# Patient Record
Sex: Female | Born: 1970 | Race: Black or African American | Hispanic: No | Marital: Married | Smoking: Current every day smoker
Health system: Southern US, Community
[De-identification: ages and names within clinical notes are randomized; demographics above are authoritative.]

## PROBLEM LIST (undated history)

## (undated) DIAGNOSIS — G4730 Sleep apnea, unspecified: Secondary | ICD-10-CM

## (undated) DIAGNOSIS — Z8719 Personal history of other diseases of the digestive system: Secondary | ICD-10-CM

## (undated) DIAGNOSIS — D649 Anemia, unspecified: Secondary | ICD-10-CM

## (undated) DIAGNOSIS — H18609 Keratoconus, unspecified, unspecified eye: Secondary | ICD-10-CM

## (undated) DIAGNOSIS — F419 Anxiety disorder, unspecified: Secondary | ICD-10-CM

## (undated) HISTORY — PX: GASTRIC BYPASS: SHX52

## (undated) NOTE — Progress Notes (Signed)
 Formatting of this note might be different from the original. Please call to order repeat in 67M.  Patient notified of results through MyChart.  Good news benign appearing cyst, radiologist recommending repeat in 6 months, we will arrange.  AA Electronically signed by Gustav CORDOBA Cecile, MD at 03/01/2022  4:39 PM EST

---

## 2006-04-10 ENCOUNTER — Encounter: Admission: RE | Admit: 2006-04-10 | Discharge: 2006-07-09 | Payer: Self-pay | Admitting: *Deleted

## 2006-04-10 ENCOUNTER — Ambulatory Visit (HOSPITAL_COMMUNITY): Admission: RE | Admit: 2006-04-10 | Discharge: 2006-04-10 | Payer: Self-pay | Admitting: *Deleted

## 2006-04-18 ENCOUNTER — Ambulatory Visit (HOSPITAL_COMMUNITY): Admission: RE | Admit: 2006-04-18 | Discharge: 2006-04-18 | Payer: Self-pay | Admitting: *Deleted

## 2006-06-26 ENCOUNTER — Ambulatory Visit (HOSPITAL_COMMUNITY): Admission: RE | Admit: 2006-06-26 | Discharge: 2006-06-26 | Payer: Self-pay | Admitting: *Deleted

## 2006-11-19 ENCOUNTER — Inpatient Hospital Stay (HOSPITAL_COMMUNITY): Admission: RE | Admit: 2006-11-19 | Discharge: 2006-11-21 | Payer: Self-pay | Admitting: *Deleted

## 2006-11-20 ENCOUNTER — Ambulatory Visit: Payer: Self-pay | Admitting: Vascular Surgery

## 2006-11-20 ENCOUNTER — Encounter (INDEPENDENT_AMBULATORY_CARE_PROVIDER_SITE_OTHER): Payer: Self-pay | Admitting: *Deleted

## 2006-11-27 ENCOUNTER — Encounter: Admission: RE | Admit: 2006-11-27 | Discharge: 2006-11-27 | Payer: Self-pay | Admitting: *Deleted

## 2010-07-11 NOTE — Op Note (Signed)
NAME:  Jill Simpson, Jill Simpson NO.:  [id number]   MEDICAL RECORD NO.:  [id number]          PATIENT TYPE:  INP   LOCATION:  1228                         FACILITY:  [facility]   PHYSICIAN:  Sharlet Salina T. Hoxworth, M.D.DATE OF BIRTH:  10-23-1970   DATE OF PROCEDURE:  DATE OF DISCHARGE:                               OPERATIVE REPORT   PROCEDURE:  Upper gastrointestinal endoscopy.   DESCRIPTION OF PROCEDURE:  Upper GI endoscopy is performed at the  completion of laparoscopic Roux-en-Y gastric bypass by Dr. Colin Benton.  The  Olympus video endoscope was inserted into the upper esophagus and then  passed under direct vision to the EG junction at 39 cm.  The small  gastric pouch was then entered.  The outlet of the pouch was clamped by  Dr. Colin Benton and the pouch was tensely distended with air under saline  irrigation to inspect for any evidence of leak, and none was seen.  The  anastomosis was visualized and was patent.  The sutures and staple lines  were intact and there was no bleeding.  The gastric mucosa appeared  normal.  The pouch measured between 4-5 cm in length and was tubular in  shape.  Following completion of the procedure, the pouch was desufflated  and the scope withdrawn without difficulty.      Lorne Skeens. Hoxworth, M.D.  Electronically Signed     BTH/MEDQ  D:  11/19/2006  T:  11/19/2006  Job:  [id number]

## 2010-07-11 NOTE — Op Note (Signed)
NAME:  Jill Simpson, Jill Simpson NO.:  [id number]   MEDICAL RECORD NO.:  [id number]          PATIENT TYPE:  INP   LOCATION:  1228                         FACILITY:  [facility]   PHYSICIAN:  Alfonse Ras, MD   DATE OF BIRTH:  11/27/1970   DATE OF PROCEDURE:  DATE OF DISCHARGE:                               OPERATIVE REPORT   PREOPERATIVE DIAGNOSIS:  Medically refractory morbid obesity.   POSTOPERATIVE DIAGNOSIS:  Medically refractory morbid obesity.   PROCEDURE:  Laparoscopic gastric Roux-en-Y gastric bypass, left facing  Roux limb, antecolic, antegastric.   SURGEON:  Alfonse Ras, M.D.   ASSISTANT:  Lorne Skeens. Hoxworth, M.D.   ANESTHESIA:  General.   DESCRIPTION OF PROCEDURE:  The patient was taken to the operating room,  placed in a supine position.  After adequate general anesthesia was  induced using endotracheal tube, the abdomen was prepped and draped in  the normal sterile fashion.  Using an Optiview 12 mm trocar in the left  upper quadrant under direct vision, peritoneal access was obtained.  Pneumoperitoneum was obtained.  Additional 12-mm trocars were placed in  the left upper quadrant and a 12-mm trocar was placed in the right  periumbilical region.  A 5-mm trocar was placed in the left abdomen.  The ligament of Treitz was identified and counting down 40 cm, the small  bowel was transected using a 45 mm white load stapling device.  The  mesentery was divided with a harmonic scalpel.  The Roux limb was marked  with a Penrose drain.  I counted additional 100 cm along the distal end  into the side-to-side jejunojejunostomy in a standard fashion using a  harmonic scalpel to make the enterotomies and then a 45-mm white load  stapling device.  The defect was closed with running 3-0 Vicryl suture.  Mesenteric defect was closed with a running 2-0 silk suture.  The  anastomosis was then inspected and reinforced using Tisseel.   Then I turned my attention to the  upper abdomen.  All tubes were removed  from the mouth except for the endotracheal tube.  The angle of His was  sharply and bluntly dissected.  An area on the lesser curve  approximately 45 cm distal to the EG junction was identified.  Dissection was taken on the lesser curve into the retrogastric space in  the lesser sac.  Using serial firings of the first gold load 60 mm GIA,  and then three additional, and lastly two additional blue load GIAs with  the Ewald down through the EG junction, the pouch was created.  Adequate  hemostasis was assured.  The remnant was oversewn using a running 2-0  silk suture.  A side-to-side gastrojejunostomy was then performed.  The  enterotomies were made with a harmonic scalpel and a 45 blue load GIA  stapling device was used to create the anastomosis.  The defect was  closed with a running 3-0 Vicryl sutures.  The anterior layer was also  closed with a running 3-0 Vicryl suture.  This was performed over the  [facility] in place.  Petersen's defect  was closed with running 2-0 silk  suture as well.  The upper abdomen was then irrigated with saline and  upper endoscopy was performed by Dr. Glenna Fellows, which showed  patent anastomosis, a 4-cm pouch,  and no evidence of leak.  The endoscope was removed.  The  gastrojejunostomy was reinforced with Tisseel.  Adequate hemostasis was  ensured.  Pneumoperitoneum was released.  Trocars were removed and skin  incisions were closed with staples.  The patient tolerated the procedure  well and went to PACU in good condition.      Alfonse Ras, MD  Electronically Signed     KRE/MEDQ  D:  11/19/2006  T:  11/19/2006  Job:  [id number]

## 2010-08-02 ENCOUNTER — Emergency Department (HOSPITAL_COMMUNITY): Admission: EM | Admit: 2010-08-02 | Payer: Self-pay | Source: Home / Self Care

## 2010-12-07 LAB — CBC
HCT: 32.3 — ABNORMAL LOW
HCT: 34.7 — ABNORMAL LOW
HCT: 38.2
Hemoglobin: 10.3 — ABNORMAL LOW
Hemoglobin: 12.3
MCHC: 31.8
MCHC: 31.9
MCHC: 32.3
MCV: 77.7 — ABNORMAL LOW
MCV: 77.7 — ABNORMAL LOW
MCV: 78
Platelets: 215
Platelets: 242
Platelets: 254
RBC: 4.15
RBC: 4.91
RDW: 16.3 — ABNORMAL HIGH
RDW: 16.5 — ABNORMAL HIGH
RDW: 16.7 — ABNORMAL HIGH
WBC: 10.3
WBC: 9.4

## 2010-12-07 LAB — DIFFERENTIAL
Basophils Absolute: 0
Basophils Absolute: 0.1
Basophils Absolute: 0.1
Basophils Relative: 0
Eosinophils Absolute: 0
Eosinophils Absolute: 0.2
Eosinophils Relative: 2
Lymphocytes Relative: 18
Lymphocytes Relative: 23
Lymphocytes Relative: 28
Lymphs Abs: 2.4
Lymphs Abs: 2.7
Monocytes Absolute: 0.6
Monocytes Relative: 6
Monocytes Relative: 6
Monocytes Relative: 6
Neutro Abs: 6
Neutro Abs: 7.2
Neutro Abs: 8.7 — ABNORMAL HIGH
Neutrophils Relative %: 64
Neutrophils Relative %: 75

## 2010-12-07 LAB — BASIC METABOLIC PANEL WITH GFR
BUN: 10
CO2: 27
Calcium: 9.5
Chloride: 108
Creatinine, Ser: 0.81
GFR calc non Af Amer: >60
Glucose, Bld: 98
Potassium: 4.4
Sodium: 143

## 2013-08-05 ENCOUNTER — Ambulatory Visit (INDEPENDENT_AMBULATORY_CARE_PROVIDER_SITE_OTHER): Payer: 59 | Admitting: Surgery

## 2013-08-05 VITALS — BP 132/84 | HR 76 | Temp 98.0°F | Resp 18 | Ht 65.0 in | Wt 280.0 lb

## 2013-08-05 DIAGNOSIS — Z9884 Bariatric surgery status: Secondary | ICD-10-CM | POA: Insufficient documentation

## 2013-08-05 MED ORDER — PANTOPRAZOLE SODIUM 40 MG PO TBEC: 40.0000 mg | DELAYED_RELEASE_TABLET | Freq: Every day | ORAL | Status: DC

## 2013-08-05 NOTE — Patient Instructions (Addendum)
Thanks for your patience.  If you need further assistance after leaving the office, please call our office and speak with a [facility] nurse.  [phone].  If you want to leave a message for Dr. Daphine DeutscherMartin, please call his office phone at [phone].  Gastric Bypass Surgery Care After Refer to this sheet in the next few weeks. These discharge instructions provide you with general information on caring for yourself after you leave the hospital. Your caregiver may also give you specific instructions. Your treatment has been planned according to the most current medical practices available, but unavoidable complications sometimes occur. If you have any problems or questions after discharge, call your caregiver. HOME CARE INSTRUCTIONS  Activity  Take frequent walks throughout the day. This will help to prevent blood clots. Do not sit for longer than 45 minutes to 1 hour while awake for 4 to 6 weeks after surgery.  Continue to do coughing and deep breathing exercises once you get home. This will help to prevent pneumonia.  Do not do strenuous activities, such as heavy lifting, pushing, or pulling, until after your follow-up visit with your caregiver. Do not lift anything heavier than 10 lb (4.5 kg).  Talk with your caregiver about when you may return to work and your exercise routine.  Do not drive while taking prescription pain medicine. Nutrition  It is very important that you drink at least 80 oz (2,400 mL) of fluid a day.  You should stay on a liquid diet until your follow-up visit with your caregiver. Keep sugar-free, liquid items on hand, including:  Tea: hot or cold. Drink only decaffeinated for the first month.  Broths: beef, chicken, vegetable.  Others: water, sugar-free frozen ice pops, flavored water, gelatin (after 1 week).  Do not consume caffeine for 1 month. Large amounts of caffeine can cause dehydration.  A dietician may also give you specific instructions.  Follow your  caregiver's recommendations about vitamins and protein requirements after surgery. Hygiene  You may shower and wash your hair 2 days after surgery. Pat incisions dry. Do not rub incisions with a washcloth or towel.  Follow your caregiver's recommendations about baths and pools following surgery. Pain control  If a prescription medicine was given, follow your caregiver's directions.  You may feel some gas pain caused by the carbon dioxide used to inflate your abdomen during surgery. This pain can be felt in your chest, shoulder, back, or abdominal area. Moving around often is advised. Incision care  You may have 4 or more small incisions. They are closed with skin adhesive strips. Skin adhesive strips can get wet and will fall off on their own. Check your incisions and surrounding area daily for any redness, swelling, discoloration, fluid (drainage), or bleeding. Dark red, dried blood may appear under these coverings. This is normal.  If you have a drain, it will be removed at your follow-up visit or before you leave the hospital.  If your drain is left in, follow your caregiver's instructions on drain care.  If your drain is taken out, keep a clean, dry bandage over the drain site. SEEK MEDICAL CARE IF:   You develop persistent nausea and vomiting.  You have pain and discomfort with swallowing.  You have pain, swelling, or warmth in the lower extremities.  You have an oral temperature above 102 F (38.9 C).  You develop chills.  Your incision sites look red, swollen, or have drainage.  Your stool is black, tarry, or maroon in color.  You are lightheaded when standing.  You notice a bruise getting larger.  You have any questions or concerns. SEEK IMMEDIATE MEDICAL CARE IF:   You have chest pain.  You have severe calf pain or pain not relieved by medicine.  You develop shortness of breath or difficulty breathing.  There is bright red blood coming from the drain.  You  feel confused.  You have slurred speech.  You suddenly feel weak. MAKE SURE YOU:   Understand these instructions.  Will watch your condition.  Will get help right away if you are not doing well or get worse. Document Released: 09/27/2003 Document Revised: 06/09/2012 Document Reviewed: 07/05/2009 [employer] Patient Information 2014 [place], [place].

## 2013-08-05 NOTE — Addendum Note (Signed)
Addended by: Luretha Murphy B on: 08/05/2013 11:19 AM   Modules accepted: Orders

## 2013-08-05 NOTE — Progress Notes (Addendum)
Jill Simpson 43 y.o.  Body mass index is 46.59 kg/(m^2).  Patient Active Problem List   Diagnosis Date Noted  . 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 No primary provider on file. 1. Gastric bypass status for obesity   2. Lap Roux Y Gastric Bypass Sept 2008 Earle     Overall has done well after her roux en Y Gastric bypass by Dr. Colin Benton back in 2008. She has had some weight regain and she is currently talking to a nutritionist about this. She has occasional burning pains in her stomach and I think this may be some gastritis., Treat her with a proton pump inhibitor empirically. Also because of her weight regain she is inch didn't further restriction. A likely get an upper GI to assess the size of her pouch and see whether she would benefit from either band over bypass or re\re plicating her pouch. We'll go and obtain an upper GI series and see her back in 3-4 weeks.   Matt B. Daphine Deutscher, MD, [facility] [phone] beeper [phone]  08/05/2013 11:13 AM   Protonix 40mg  ordered to treat gastritis empirically

## 2013-08-06 ENCOUNTER — Other Ambulatory Visit: Payer: Self-pay

## 2013-08-10 ENCOUNTER — Ambulatory Visit
Admission: RE | Admit: 2013-08-10 | Discharge: 2013-08-10 | Disposition: A | Discharge status: Home / Self Care | Payer: Self-pay | Source: Ambulatory Visit | Attending: Surgery | Admitting: Surgery

## 2013-08-10 ENCOUNTER — Other Ambulatory Visit (INDEPENDENT_AMBULATORY_CARE_PROVIDER_SITE_OTHER): Payer: Self-pay | Admitting: Surgery

## 2013-08-10 DIAGNOSIS — Z9884 Bariatric surgery status: Secondary | ICD-10-CM

## 2013-08-25 ENCOUNTER — Ambulatory Visit (INDEPENDENT_AMBULATORY_CARE_PROVIDER_SITE_OTHER): Payer: 59 | Admitting: Surgery

## 2013-08-25 ENCOUNTER — Encounter (INDEPENDENT_AMBULATORY_CARE_PROVIDER_SITE_OTHER): Payer: Self-pay | Admitting: Surgery

## 2013-08-25 VITALS — BP 165/100 | HR 76 | Temp 98.1°F | Resp 16 | Ht 65.0 in | Wt 280.6 lb

## 2013-08-25 DIAGNOSIS — K449 Diaphragmatic hernia without obstruction or gangrene: Secondary | ICD-10-CM

## 2013-08-25 DIAGNOSIS — Z9884 Bariatric surgery status: Secondary | ICD-10-CM

## 2013-08-25 NOTE — Progress Notes (Signed)
Chief Complaint:  Hiatal hernia after gastric band  History of Present Illness:  Jill Simpson is an 43 y.o. female who underwent a Roux-en-Y gastric bypass for referral in 2008. She has developed some issues with weight regain and also with swallowing difficulties. She had an upper GI series which are reviewed showed her to have a hiatal hernia the pains about half of her pouch which is enlarged. We discussed various means a rim E. This and finally discussed going ahead and performing a laparoscopic hiatal hernia repair and then probably plicating the pouch. We also discussed band of her bypass that she is less than physiatrist at about this is an option.  History reviewed. No pertinent past medical history.  History reviewed. No pertinent past surgical history.  Current Outpatient Prescriptions  Medication Sig Dispense Refill  . HYDROMET 5-1.5 MG/5ML syrup       . meloxicam (MOBIC) 7.5 MG tablet       . oxyCODONE-acetaminophen (PERCOCET/ROXICET) 5-325 MG per tablet       . pantoprazole (PROTONIX) 40 MG tablet Take 1 tablet (40 mg total) by mouth daily.  45 tablet  1  . venlafaxine XR (EFFEXOR-XR) 37.5 MG 24 hr capsule       . zolpidem (AMBIEN) 10 MG tablet        No current facility-administered medications for this visit.   Review of patient's allergies indicates no known allergies. History reviewed. No pertinent family history. Social History:   reports that she has been smoking Cigarettes.  She has been smoking about 0.00 packs per day. She does not have any smokeless tobacco history on file. She reports that she does not drink alcohol or use illicit drugs.   REVIEW OF SYSTEMS : Positive for reflux ; otherwise negative  Physical Exam:   Blood pressure 165/100, pulse 76, temperature 98.1 F (36.7 C), resp. rate 16, height 5\' 5"  (1.651 m), weight 280 lb 9.6 oz (127.279 kg). Body mass index is 46.69 kg/(m^2).  Gen:  WDWN African American female NAD  Neurological: Alert and oriented  to person, place, and time. Motor and sensory function is grossly intact  Head: Normocephalic and atraumatic.  Eyes: Conjunctivae are normal. Pupils are equal, round, and reactive to light. No scleral icterus.  Neck: Normal range of motion. Neck supple. No tracheal deviation or thyromegaly present.  Cardiovascular:  SR without murmurs or gallops.  No carotid bruits Breast:  Not examined Respiratory: Effort normal.  No respiratory distress. No chest wall tenderness. Breath sounds normal.  No wheezes, rales or rhonchi.  Abdomen:  Obese nontender GU:  Not examined Musculoskeletal: Normal range of motion. Extremities are nontender. No cyanosis, edema or clubbing noted Lymphadenopathy: No cervical, preauricular, postauricular or axillary adenopathy is present Skin: Skin is warm and dry. No rash noted. No diaphoresis. No erythema. No pallor. Pscyh: Normal mood and affect. Behavior is normal. Judgment and thought content normal.   LABORATORY RESULTS: No results found for this or any previous visit (from the past 48 hour(s)).   RADIOLOGY RESULTS: No results found.  Problem List: Patient Active Problem List   Diagnosis Date Noted  . 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 Will be to schedule a bariatric procedure including repair of hiatal hernia and possible pouch plication.    Matt B. Daphine DeutscherMartin, MD, [facility]  [facility] [phone] beeper [phone]  08/25/2013 5:10 PM

## 2013-08-25 NOTE — Patient Instructions (Signed)

## 2013-09-24 ENCOUNTER — Other Ambulatory Visit (INDEPENDENT_AMBULATORY_CARE_PROVIDER_SITE_OTHER): Payer: Self-pay

## 2013-09-24 DIAGNOSIS — Z01818 Encounter for other preprocedural examination: Secondary | ICD-10-CM

## 2013-10-16 ENCOUNTER — Other Ambulatory Visit (INDEPENDENT_AMBULATORY_CARE_PROVIDER_SITE_OTHER): Payer: Self-pay | Admitting: Surgery

## 2013-11-05 ENCOUNTER — Ambulatory Visit: Payer: Self-pay | Admitting: Dietician

## 2013-11-06 ENCOUNTER — Encounter (HOSPITAL_COMMUNITY): Payer: Self-pay | Admitting: Pharmacy Technician

## 2013-11-11 NOTE — Patient Instructions (Addendum)
Nery Kalisz  11/11/2013   Your procedure is scheduled on:  11/17/2013   Report to [facility].  Follow the Signs to Short Stay Center at  0530      am  Call this number if you have problems the morning of surgery: [phone]   Remember:   Do not eat food or drink liquids after midnight.   Take these medicines the morning of surgery with A SIP OF WATER: protonix if needed, oxycodone if needed, , effexor   Do not wear jewelry, make-up or nail polish.  Do not wear lotions, powders, or perfumes.  deodorant.  Do not shave 48 hours prior to surgery.   Do not bring valuables to the hospital.  Contacts, dentures or bridgework may not be worn into surgery.  Leave suitcase in the car. After surgery it may be brought to your room.  For patients admitted to the hospital, checkout time is 11:00 AM the day of  discharge.     :      Please read over the following fact sheets that you were given: [facility] - Preparing for Surgery Before surgery, you can play an important role.  Because skin is not sterile, your skin needs to be as free of germs as possible.  You can reduce the number of germs on your skin by washing with CHG (chlorahexidine gluconate) soap before surgery.  CHG is an antiseptic cleaner which kills germs and bonds with the skin to continue killing germs even after washing. Please DO NOT use if you have an allergy to CHG or antibacterial soaps.  If your skin becomes reddened/irritated stop using the CHG and inform your nurse when you arrive at Short Stay. Do not shave (including legs and underarms) for at least 48 hours prior to the first CHG shower.  You may shave your face/neck. Please follow these instructions carefully:  1.  Shower with CHG Soap the night before surgery and the  morning of Surgery.  2.  If you choose to wash your hair, wash your hair first as usual with your  normal  shampoo.  3.  After you shampoo, rinse your hair and body thoroughly to remove the   shampoo.                           4.  Use CHG as you would any other liquid soap.  You can apply chg directly  to the skin and wash                       Gently with a scrungie or clean washcloth.  5.  Apply the CHG Soap to your body ONLY FROM THE NECK DOWN.   Do not use on face/ open                           Wound or open sores. Avoid contact with eyes, ears mouth and genitals (private parts).                       Wash face,  Genitals (private parts) with your normal soap.             6.  Wash thoroughly, paying special attention to the area where your surgery  will be performed.  7.  Thoroughly rinse your body with warm water from the neck down.  8.  DO NOT shower/wash with your normal soap after using and rinsing off  the CHG Soap.                9.  Pat yourself dry with a clean towel.            10.  Wear clean pajamas.            11.  Place clean sheets on your bed the night of your first shower and do not  sleep with pets. Day of Surgery : Do not apply any lotions/deodorants the morning of surgery.  Please wear clean clothes to the hospital/surgery center.  FAILURE TO FOLLOW THESE INSTRUCTIONS MAY RESULT IN THE CANCELLATION OF YOUR SURGERY PATIENT SIGNATURE_________________________________  NURSE SIGNATURE__________________________________  ________________________________________________________________________ , coughing and deep breathing exercises, leg exercises

## 2013-11-12 ENCOUNTER — Encounter (HOSPITAL_COMMUNITY): Payer: Self-pay

## 2013-11-12 ENCOUNTER — Encounter (HOSPITAL_COMMUNITY)
Admission: RE | Admit: 2013-11-12 | Discharge: 2013-11-12 | Disposition: A | Discharge status: Home / Self Care | Payer: 59 | Source: Ambulatory Visit | Attending: Surgery | Admitting: Surgery

## 2013-11-12 ENCOUNTER — Encounter (INDEPENDENT_AMBULATORY_CARE_PROVIDER_SITE_OTHER): Payer: Self-pay

## 2013-11-12 DIAGNOSIS — K219 Gastro-esophageal reflux disease without esophagitis: Secondary | ICD-10-CM | POA: Insufficient documentation

## 2013-11-12 DIAGNOSIS — Z01812 Encounter for preprocedural laboratory examination: Secondary | ICD-10-CM | POA: Insufficient documentation

## 2013-11-12 DIAGNOSIS — R635 Abnormal weight gain: Secondary | ICD-10-CM | POA: Diagnosis not present

## 2013-11-12 DIAGNOSIS — K449 Diaphragmatic hernia without obstruction or gangrene: Secondary | ICD-10-CM | POA: Diagnosis not present

## 2013-11-12 HISTORY — DX: Personal history of other diseases of the digestive system: Z87.19

## 2013-11-12 HISTORY — DX: Anemia, unspecified: D64.9

## 2013-11-12 HISTORY — DX: Keratoconus, unspecified, unspecified eye: H18.609

## 2013-11-12 HISTORY — DX: Anxiety disorder, unspecified: F41.9

## 2013-11-12 LAB — COMPREHENSIVE METABOLIC PANEL
ALBUMIN: 3.2 g/dL — AB (ref 3.5–5.2)
ALT: 12 U/L (ref 0–35)
AST: 17 U/L (ref 0–37)
Alkaline Phosphatase: 66 U/L (ref 39–117)
Anion gap: 11 (ref 5–15)
BUN: 10 mg/dL (ref 6–23)
CALCIUM: 8.9 mg/dL (ref 8.4–10.5)
CO2: 24 meq/L (ref 19–32)
CREATININE: 0.72 mg/dL (ref 0.50–1.10)
Chloride: 108 mEq/L (ref 96–112)
GFR calc Af Amer: >90 mL/min (ref >90)
Glucose, Bld: 88 mg/dL (ref 70–99)
Potassium: 4.9 mEq/L (ref 3.7–5.3)
SODIUM: 143 meq/L (ref 137–147)
TOTAL PROTEIN: 7.5 g/dL (ref 6.0–8.3)
Total Bilirubin: 0.5 mg/dL (ref 0.3–1.2)

## 2013-11-12 LAB — CBC WITH DIFFERENTIAL/PLATELET
BASOS ABS: 0 10*3/uL (ref 0.0–0.1)
BASOS PCT: 0 % (ref 0–1)
EOS PCT: 2 % (ref 0–5)
Eosinophils Absolute: 0.2 10*3/uL (ref 0.0–0.7)
HEMATOCRIT: 39.2 % (ref 36.0–46.0)
Hemoglobin: 12.1 g/dL (ref 12.0–15.0)
LYMPHS PCT: 29 % (ref 12–46)
Lymphs Abs: 2.6 10*3/uL (ref 0.7–4.0)
MCH: 25.9 pg — ABNORMAL LOW (ref 26.0–34.0)
MCHC: 30.9 g/dL (ref 30.0–36.0)
MCV: 83.9 fL (ref 78.0–100.0)
Monocytes Absolute: 0.8 10*3/uL (ref 0.1–1.0)
Monocytes Relative: 9 % (ref 3–12)
Neutro Abs: 5.4 10*3/uL (ref 1.7–7.7)
Neutrophils Relative %: 60 % (ref 43–77)
PLATELETS: 219 10*3/uL (ref 150–400)
RBC: 4.67 MIL/uL (ref 3.87–5.11)
RDW: 16.4 % — ABNORMAL HIGH (ref 11.5–15.5)
WBC: 9 10*3/uL (ref 4.0–10.5)

## 2013-11-12 NOTE — Progress Notes (Signed)
CMP results faxed via EPIC to Dr Daphine Deutscher.

## 2013-11-16 NOTE — H&P (Signed)
Chief Complaint: Hiatal hernia after roux en Y gastric bypass by Dr. Colin Benton  History of Present Illness: Jill Simpson is an 43 y.o. female who underwent a Roux-en-Y gastric bypass for obesity in 2008. She has developed some issues with weight regain and also with swallowing difficulties. She had an upper GI series which are reviewed showed her to have a hiatal hernia the pains about half of her pouch which is enlarged. We discussed various means address this and finally discussed going ahead and performing a laparoscopic hiatal hernia repair and then probably plicating the pouch. We also discussed band of her bypass that she is less than enthusiastic about this option.    History reviewed. No pertinent past medical history.  History reviewed. No pertinent past surgical history.  Current Outpatient Prescriptions   Medication  Sig  Dispense  Refill   .  HYDROMET 5-1.5 MG/5ML syrup      .  meloxicam (MOBIC) 7.5 MG tablet      .  oxyCODONE-acetaminophen (PERCOCET/ROXICET) 5-325 MG per tablet      .  pantoprazole (PROTONIX) 40 MG tablet  Take 1 tablet (40 mg total) by mouth daily.  45 tablet  1   .  venlafaxine XR (EFFEXOR-XR) 37.5 MG 24 hr capsule      .  zolpidem (AMBIEN) 10 MG tablet       No current facility-administered medications for this visit.   Review of patient's allergies indicates no known allergies.  History reviewed. No pertinent family history.  Social History: reports that she has been smoking Cigarettes. She has been smoking about 0.00 packs per day. She does not have any smokeless tobacco history on file. She reports that she does not drink alcohol or use illicit drugs.  REVIEW OF SYSTEMS :  Positive for reflux ; otherwise negative  Physical Exam:  Blood pressure 165/100, pulse 76, temperature 98.1 F (36.7 C), resp. rate 16, height  (1.651 m), weight 280 lb 9.6 oz (127.279 kg).  Body mass index is 46.69 kg/(m^2).  Gen: WDWN African American female NAD  Neurological: Alert  and oriented to person, place, and time. Motor and sensory function is grossly intact  Head: Normocephalic and atraumatic.  Eyes: Conjunctivae are normal. Pupils are equal, round, and reactive to light. No scleral icterus.  Neck: Normal range of motion. Neck supple. No tracheal deviation or thyromegaly present.  Cardiovascular: SR without murmurs or gallops. No carotid bruits  Breast: Not examined  Respiratory: Effort normal. No respiratory distress. No chest wall tenderness. Breath sounds normal. No wheezes, rales or rhonchi.  Abdomen: Obese nontender  GU: Not examined  Musculoskeletal: Normal range of motion. Extremities are nontender. No cyanosis, edema or clubbing noted Lymphadenopathy: No cervical, preauricular, postauricular or axillary adenopathy is present Skin: Skin is warm and dry. No rash noted. No diaphoresis. No erythema. No pallor. Pscyh: Normal mood and affect. Behavior is normal. Judgment and thought content normal.  LABORATORY RESULTS:  No results found for this or any previous visit (from the past 48 hour(s)).  RADIOLOGY RESULTS:   UGI:  Intermittent type 1 hiatal hernia observed to include about half  of the gastric pouch. There is likely some mild delayed  postoperative stretching of the gastric pouch.    Problem List:  Patient Active Problem List    Diagnosis  Date Noted   .  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 Repair of hiatal hernia and possible plication of pouch.    Matt B. Daphine Deutscher, MD, [facility]  [phone] beeper  [phone]

## 2013-11-17 ENCOUNTER — Encounter (HOSPITAL_COMMUNITY): Payer: Self-pay

## 2013-11-17 ENCOUNTER — Inpatient Hospital Stay (HOSPITAL_COMMUNITY): Payer: 59 | Admitting: Anesthesiology

## 2013-11-17 ENCOUNTER — Encounter (HOSPITAL_COMMUNITY): Payer: 59 | Admitting: Anesthesiology

## 2013-11-17 ENCOUNTER — Observation Stay (HOSPITAL_COMMUNITY)
Admission: RE | Admit: 2013-11-17 | Discharge: 2013-11-18 | Disposition: A | Discharge status: Home / Self Care | Payer: 59 | Source: Ambulatory Visit | Attending: Surgery | Admitting: Surgery

## 2013-11-17 ENCOUNTER — Encounter (HOSPITAL_COMMUNITY)
Admission: RE | Disposition: A | Discharge status: Home / Self Care | Payer: Self-pay | Source: Ambulatory Visit | Attending: Surgery

## 2013-11-17 DIAGNOSIS — Z9884 Bariatric surgery status: Secondary | ICD-10-CM | POA: Diagnosis not present

## 2013-11-17 DIAGNOSIS — Z934 Other artificial openings of gastrointestinal tract status: Secondary | ICD-10-CM | POA: Diagnosis not present

## 2013-11-17 DIAGNOSIS — K219 Gastro-esophageal reflux disease without esophagitis: Secondary | ICD-10-CM | POA: Diagnosis not present

## 2013-11-17 DIAGNOSIS — Z6841 Body Mass Index (BMI) 40.0 and over, adult: Secondary | ICD-10-CM | POA: Diagnosis not present

## 2013-11-17 DIAGNOSIS — K449 Diaphragmatic hernia without obstruction or gangrene: Secondary | ICD-10-CM | POA: Diagnosis present

## 2013-11-17 HISTORY — DX: Sleep apnea, unspecified: G47.30

## 2013-11-17 HISTORY — PX: HIATAL HERNIA REPAIR: SHX195

## 2013-11-17 HISTORY — PX: UPPER GI ENDOSCOPY: SHX6162

## 2013-11-17 LAB — CBC
HCT: 39.3 % (ref 36.0–46.0)
HEMOGLOBIN: 12.2 g/dL (ref 12.0–15.0)
MCH: 26 pg (ref 26.0–34.0)
MCHC: 31 g/dL (ref 30.0–36.0)
MCV: 83.8 fL (ref 78.0–100.0)
Platelets: 199 10*3/uL (ref 150–400)
RBC: 4.69 MIL/uL (ref 3.87–5.11)
RDW: 16 % — ABNORMAL HIGH (ref 11.5–15.5)
WBC: 13.7 10*3/uL — ABNORMAL HIGH (ref 4.0–10.5)

## 2013-11-17 LAB — CREATININE, SERUM
CREATININE: 0.73 mg/dL (ref 0.50–1.10)
GFR calc Af Amer: >90 mL/min (ref >90)

## 2013-11-17 LAB — PREGNANCY, URINE: PREG TEST UR: NEGATIVE

## 2013-11-17 SURGERY — REPAIR, HERNIA, HIATAL, LAPAROSCOPIC
Anesthesia: General | Site: Abdomen

## 2013-11-17 MED ORDER — CHLORHEXIDINE GLUCONATE 0.12 % MT SOLN
15.0000 mL | Freq: Two times a day (BID) | OROMUCOSAL | Status: DC
  Administered 2013-11-17 – 2013-11-18 (×2): 15 mL via OROMUCOSAL
  Filled 2013-11-17 (×5): qty 15

## 2013-11-17 MED ORDER — CHLORHEXIDINE GLUCONATE CLOTH 2 % EX PADS: 6.0000 | MEDICATED_PAD | Freq: Once | CUTANEOUS | Status: DC

## 2013-11-17 MED ORDER — GLYCOPYRROLATE 0.2 MG/ML IJ SOLN
INTRAMUSCULAR | Status: AC
  Filled 2013-11-17: qty 4

## 2013-11-17 MED ORDER — MIDAZOLAM HCL 5 MG/5ML IJ SOLN
INTRAMUSCULAR | Status: DC | PRN
  Administered 2013-11-17: 2 mg via INTRAVENOUS

## 2013-11-17 MED ORDER — HEPARIN SODIUM (PORCINE) 5000 UNIT/ML IJ SOLN
5000.0000 [IU] | Freq: Three times a day (TID) | INTRAMUSCULAR | Status: DC
  Administered 2013-11-17: 5000 [IU] via SUBCUTANEOUS
  Filled 2013-11-17 (×7): qty 1

## 2013-11-17 MED ORDER — FENTANYL CITRATE 0.05 MG/ML IJ SOLN
INTRAMUSCULAR | Status: AC
  Filled 2013-11-17: qty 2

## 2013-11-17 MED ORDER — UNJURY CHICKEN SOUP POWDER
2.0000 [oz_av] | Freq: Four times a day (QID) | ORAL | Status: DC
  Filled 2013-11-17 (×4): qty 27

## 2013-11-17 MED ORDER — FENTANYL CITRATE 0.05 MG/ML IJ SOLN
INTRAMUSCULAR | Status: AC
Start: 2013-11-17 — End: 2013-11-17
  Filled 2013-11-17: qty 5

## 2013-11-17 MED ORDER — SUCCINYLCHOLINE CHLORIDE 20 MG/ML IJ SOLN
INTRAMUSCULAR | Status: DC | PRN
  Administered 2013-11-17: 200 mg via INTRAVENOUS

## 2013-11-17 MED ORDER — CISATRACURIUM BESYLATE 20 MG/10ML IV SOLN
INTRAVENOUS | Status: AC
  Filled 2013-11-17: qty 10

## 2013-11-17 MED ORDER — GLYCOPYRROLATE 0.2 MG/ML IJ SOLN
INTRAMUSCULAR | Status: DC | PRN
  Administered 2013-11-17: .8 mg via INTRAVENOUS

## 2013-11-17 MED ORDER — HYDROMORPHONE HCL 1 MG/ML IJ SOLN
0.2500 mg | INTRAMUSCULAR | Status: DC | PRN
  Administered 2013-11-17 (×2): 0.5 mg via INTRAVENOUS

## 2013-11-17 MED ORDER — CETYLPYRIDINIUM CHLORIDE 0.05 % MT LIQD
7.0000 mL | Freq: Two times a day (BID) | OROMUCOSAL | Status: DC
  Administered 2013-11-17: 7 mL via OROMUCOSAL

## 2013-11-17 MED ORDER — MORPHINE SULFATE 2 MG/ML IJ SOLN
2.0000 mg | INTRAMUSCULAR | Status: DC | PRN
  Administered 2013-11-17 – 2013-11-18 (×5): 4 mg via INTRAVENOUS
  Filled 2013-11-17 (×5): qty 2

## 2013-11-17 MED ORDER — HYDROMORPHONE HCL 1 MG/ML IJ SOLN
INTRAMUSCULAR | Status: AC
  Filled 2013-11-17: qty 1

## 2013-11-17 MED ORDER — ACETAMINOPHEN 10 MG/ML IV SOLN
1000.0000 mg | Freq: Once | INTRAVENOUS | Status: AC
  Administered 2013-11-17: 1000 mg via INTRAVENOUS
  Filled 2013-11-17: qty 100

## 2013-11-17 MED ORDER — MIDAZOLAM HCL 2 MG/2ML IJ SOLN
INTRAMUSCULAR | Status: AC
Start: 2013-11-17 — End: 2013-11-17
  Filled 2013-11-17: qty 2

## 2013-11-17 MED ORDER — PROPOFOL 10 MG/ML IV BOLUS
INTRAVENOUS | Status: DC | PRN
  Administered 2013-11-17: 200 mg via INTRAVENOUS

## 2013-11-17 MED ORDER — FENTANYL CITRATE 0.05 MG/ML IJ SOLN
INTRAMUSCULAR | Status: DC | PRN
  Administered 2013-11-17 (×2): 50 ug via INTRAVENOUS
  Administered 2013-11-17: 100 ug via INTRAVENOUS
  Administered 2013-11-17 (×2): 50 ug via INTRAVENOUS

## 2013-11-17 MED ORDER — LACTATED RINGERS IV SOLN
INTRAVENOUS | Status: DC | PRN
  Administered 2013-11-17 (×2): via INTRAVENOUS

## 2013-11-17 MED ORDER — NEOSTIGMINE METHYLSULFATE 10 MG/10ML IV SOLN
INTRAVENOUS | Status: DC | PRN
  Administered 2013-11-17: 5 mg via INTRAVENOUS

## 2013-11-17 MED ORDER — UNJURY VANILLA POWDER
2.0000 [oz_av] | Freq: Four times a day (QID) | ORAL | Status: DC
Start: 2013-11-18 — End: 2013-11-18
  Filled 2013-11-17 (×4): qty 27

## 2013-11-17 MED ORDER — CHLORHEXIDINE GLUCONATE CLOTH 2 % EX PADS
6.0000 | MEDICATED_PAD | Freq: Once | CUTANEOUS | Status: DC
Start: 2013-11-17 — End: 2013-11-17

## 2013-11-17 MED ORDER — PROPOFOL 10 MG/ML IV BOLUS
INTRAVENOUS | Status: AC
  Filled 2013-11-17: qty 20

## 2013-11-17 MED ORDER — UNJURY CHOCOLATE CLASSIC POWDER
2.0000 [oz_av] | Freq: Four times a day (QID) | ORAL | Status: DC
  Filled 2013-11-17 (×4): qty 27

## 2013-11-17 MED ORDER — ACETAMINOPHEN 160 MG/5ML PO SOLN: 325.0000 mg | ORAL | Status: DC | PRN

## 2013-11-17 MED ORDER — ONDANSETRON HCL 4 MG/2ML IJ SOLN
4.0000 mg | INTRAMUSCULAR | Status: DC | PRN
Start: 2013-11-17 — End: 2013-11-18
  Administered 2013-11-17 – 2013-11-18 (×4): 4 mg via INTRAVENOUS
  Filled 2013-11-17 (×4): qty 2

## 2013-11-17 MED ORDER — OXYCODONE HCL 5 MG/5ML PO SOLN: 5.0000 mg | ORAL | Status: DC | PRN

## 2013-11-17 MED ORDER — ACETAMINOPHEN 160 MG/5ML PO SOLN: 650.0000 mg | ORAL | Status: DC | PRN

## 2013-11-17 MED ORDER — LACTATED RINGERS IV SOLN: INTRAVENOUS | Status: DC

## 2013-11-17 MED ORDER — DEXAMETHASONE SODIUM PHOSPHATE 10 MG/ML IJ SOLN
INTRAMUSCULAR | Status: DC | PRN
  Administered 2013-11-17: 10 mg via INTRAVENOUS

## 2013-11-17 MED ORDER — KCL IN DEXTROSE-NACL 20-5-0.45 MEQ/L-%-% IV SOLN
INTRAVENOUS | Status: DC
  Administered 2013-11-17: 13:00:00 via INTRAVENOUS
  Administered 2013-11-18: 1000 mL via INTRAVENOUS
  Filled 2013-11-17 (×4): qty 1000

## 2013-11-17 MED ORDER — CISATRACURIUM BESYLATE (PF) 10 MG/5ML IV SOLN
INTRAVENOUS | Status: DC | PRN
  Administered 2013-11-17: 7 mg via INTRAVENOUS
  Administered 2013-11-17 (×2): 4 mg via INTRAVENOUS
  Administered 2013-11-17: 2 mg via INTRAVENOUS

## 2013-11-17 MED ORDER — METOCLOPRAMIDE HCL 5 MG/ML IJ SOLN
INTRAMUSCULAR | Status: AC
  Filled 2013-11-17: qty 2

## 2013-11-17 MED ORDER — HEPARIN SODIUM (PORCINE) 5000 UNIT/ML IJ SOLN
5000.0000 [IU] | INTRAMUSCULAR | Status: AC
Start: 2013-11-17 — End: 2013-11-17
  Administered 2013-11-17: 5000 [IU] via SUBCUTANEOUS
  Filled 2013-11-17: qty 1

## 2013-11-17 MED ORDER — ONDANSETRON HCL 4 MG/2ML IJ SOLN
INTRAMUSCULAR | Status: AC
  Filled 2013-11-17: qty 2

## 2013-11-17 MED ORDER — DEXAMETHASONE SODIUM PHOSPHATE 10 MG/ML IJ SOLN
INTRAMUSCULAR | Status: AC
  Filled 2013-11-17: qty 1

## 2013-11-17 MED ORDER — BUPIVACAINE LIPOSOME 1.3 % IJ SUSP
20.0000 mL | Freq: Once | INTRAMUSCULAR | Status: AC
  Administered 2013-11-17: 20 mL
  Filled 2013-11-17: qty 20

## 2013-11-17 MED ORDER — CEFOXITIN SODIUM 2 G IV SOLR
INTRAVENOUS | Status: AC
  Filled 2013-11-17: qty 2

## 2013-11-17 MED ORDER — CEFOXITIN SODIUM 2 G IV SOLR
2.0000 g | INTRAVENOUS | Status: AC
  Administered 2013-11-17: 2 g via INTRAVENOUS

## 2013-11-17 SURGICAL SUPPLY — 62 items
APPLIER CLIP ROT 10 11.4 M/L (STAPLE)
BENZOIN TINCTURE PRP APPL 2/3 (GAUZE/BANDAGES/DRESSINGS) ×3 IMPLANT
CABLE HIGH FREQUENCY MONO STRZ (ELECTRODE) IMPLANT
CANISTER SUCTION 2500CC (MISCELLANEOUS) IMPLANT
CLAMP ENDO BABCK 10MM (STAPLE) IMPLANT
CLIP APPLIE ROT 10 11.4 M/L (STAPLE) IMPLANT
CLOSURE WOUND 1/2 X4 (GAUZE/BANDAGES/DRESSINGS)
COVER SURGICAL LIGHT HANDLE (MISCELLANEOUS) ×3 IMPLANT
DECANTER SPIKE VIAL GLASS SM (MISCELLANEOUS) ×3 IMPLANT
DERMABOND ADVANCED (GAUZE/BANDAGES/DRESSINGS) ×2
DERMABOND ADVANCED .7 DNX12 (GAUZE/BANDAGES/DRESSINGS) ×1 IMPLANT
DEVICE SUT QUICK LOAD TK 5 (STAPLE) ×8 IMPLANT
DEVICE SUT TI-KNOT TK 5X26 (MISCELLANEOUS) ×2 IMPLANT
DEVICE SUTURE ENDOST 10MM (ENDOMECHANICALS) ×3 IMPLANT
DEVICE TI KNOT TK5 (MISCELLANEOUS) ×1
DISSECTOR BLUNT TIP ENDO 5MM (MISCELLANEOUS) ×3 IMPLANT
DRAIN PENROSE 18X1/2 LTX STRL (DRAIN) ×3 IMPLANT
DRAPE LAPAROSCOPIC ABDOMINAL (DRAPES) ×3 IMPLANT
ELECT REM PT RETURN 9FT ADLT (ELECTROSURGICAL) ×3
ELECTRODE REM PT RTRN 9FT ADLT (ELECTROSURGICAL) ×1 IMPLANT
FELT TEFLON 4 X1 (Mesh General) ×3 IMPLANT
FILTER SMOKE EVAC LAPAROSHD (FILTER) IMPLANT
GLOVE BIOGEL M 8.0 STRL (GLOVE) ×9 IMPLANT
GLOVE BIOGEL PI IND STRL 7.0 (GLOVE) ×2 IMPLANT
GLOVE BIOGEL PI INDICATOR 7.0 (GLOVE) ×4
GOWN SPEC L4 XLG W/TWL (GOWN DISPOSABLE) ×3 IMPLANT
GOWN STRL REUS W/TWL LRG LVL3 (GOWN DISPOSABLE) ×3 IMPLANT
GOWN STRL REUS W/TWL XL LVL3 (GOWN DISPOSABLE) ×12 IMPLANT
GRASPER ENDO BABCOCK 10 (MISCELLANEOUS) IMPLANT
GRASPER ENDO BABCOCK 10MM (MISCELLANEOUS)
KIT BASIN OR (CUSTOM PROCEDURE TRAY) ×3 IMPLANT
NEEDLE SPNL 22GX2.5 QUINCKE BK (NEEDLE) ×3 IMPLANT
NS IRRIG 1000ML POUR BTL (IV SOLUTION) ×3 IMPLANT
PENCIL BUTTON HOLSTER BLD 10FT (ELECTRODE) IMPLANT
QUICK LOAD TK 5 (STAPLE) ×4
SCISSORS LAP 5X35 DISP (ENDOMECHANICALS) ×3 IMPLANT
SET IRRIG TUBING LAPAROSCOPIC (IRRIGATION / IRRIGATOR) ×3 IMPLANT
SHEARS HARMONIC ACE PLUS 36CM (ENDOMECHANICALS) ×3 IMPLANT
SLEEVE ADV FIXATION 5X100MM (TROCAR) ×6 IMPLANT
SLEEVE Z-THREAD 5X100MM (TROCAR) IMPLANT
SOLUTION ANTI FOG 6CC (MISCELLANEOUS) ×3 IMPLANT
STAPLER VISISTAT 35W (STAPLE) ×3 IMPLANT
STRIP CLOSURE SKIN 1/2X4 (GAUZE/BANDAGES/DRESSINGS) IMPLANT
SUT SURGIDAC NAB ES-9 0 48 120 (SUTURE) ×12 IMPLANT
SUT VIC AB 4-0 SH 18 (SUTURE) ×3 IMPLANT
SYR 30ML LL (SYRINGE) ×3 IMPLANT
SYR 50ML LL SCALE MARK (SYRINGE) ×3 IMPLANT
TIP INNERVISION DETACH 40FR (MISCELLANEOUS) IMPLANT
TIP INNERVISION DETACH 50FR (MISCELLANEOUS) IMPLANT
TIP INNERVISION DETACH 56FR (MISCELLANEOUS) IMPLANT
TIPS INNERVISION DETACH 40FR (MISCELLANEOUS)
TRAY FOLEY CATH 14FRSI W/METER (CATHETERS) ×3 IMPLANT
TRAY LAP CHOLE (CUSTOM PROCEDURE TRAY) ×3 IMPLANT
TROCAR ADV FIXATION 11X100MM (TROCAR) ×3 IMPLANT
TROCAR ADV FIXATION 5X100MM (TROCAR) ×3 IMPLANT
TROCAR BLADELESS OPT 5 100 (ENDOMECHANICALS) ×3 IMPLANT
TROCAR XCEL BLUNT TIP 100MML (ENDOMECHANICALS) IMPLANT
TROCAR XCEL NON-BLD 11X100MML (ENDOMECHANICALS) IMPLANT
TROCAR XCEL UNIV SLVE 11M 100M (ENDOMECHANICALS) IMPLANT
TUBING ENDO SMARTCAP (MISCELLANEOUS) ×3 IMPLANT
TUBING FILTER THERMOFLATOR (ELECTROSURGICAL) ×3 IMPLANT
YANKAUER SUCT BULB TIP 10FT TU (MISCELLANEOUS) ×3 IMPLANT

## 2013-11-17 NOTE — Progress Notes (Signed)
Pt has been reviewed by respiratory. No nebs needed at this time. Vitals stable no current chest x-ray.

## 2013-11-17 NOTE — Interval H&P Note (Signed)
History and Physical Interval Note:  11/17/2013 7:23 AM  Jill Simpson  has presented today for surgery, with the diagnosis of laparoscopic hiatal hernia /plicating the pouch  The various methods of treatment have been discussed with the patient and family. After consideration of risks, benefits and other options for treatment, the patient has consented to  Procedure(s): LAPAROSCOPIC REPAIR OF HIATAL HERNIA AND REPLICATION OF POUCH (N/A) as a surgical intervention .  The patient's history has been reviewed, patient examined, no change in status, stable for surgery.  I have reviewed the patient's chart and labs.  Questions were answered to the patient's satisfaction.  I have explained that the success of our operation depends upon our ability to dissect through the scarring from the prior surgery and gain access to the hiatus.     Americus Perkey B

## 2013-11-17 NOTE — Transfer of Care (Signed)
Immediate Anesthesia Transfer of Care Note  Patient: Jill Simpson  Procedure(s) Performed: Procedure(s): LAPAROSCOPIC REPAIR OF HIATAL HERNIA AND REPLICATION OF POUCH (N/A) UPPER GI ENDOSCOPY (N/A)  Patient Location: PACU  Anesthesia Type:General  Level of Consciousness: awake, sedated and patient cooperative  Airway & Oxygen Therapy: Patient Spontanous Breathing and Patient connected to face mask oxygen  Post-op Assessment: Report given to PACU RN and Post -op Vital signs reviewed and stable  Post vital signs: Reviewed and stable  Complications: No apparent anesthesia complications

## 2013-11-17 NOTE — Anesthesia Preprocedure Evaluation (Addendum)
Anesthesia Evaluation  Patient identified by MRN, date of birth, ID band Patient awake    Reviewed: Allergy & Precautions, H&P , NPO status , Patient's Chart, lab work & pertinent test results  Airway Mallampati: III TM Distance: >3 FB Neck ROM: full    Dental no notable dental hx. (+) Teeth Intact, Dental Advisory Given   Pulmonary sleep apnea and Continuous Positive Airway Pressure Ventilation , Current Smoker,  breath sounds clear to auscultation  Pulmonary exam normal       Cardiovascular Exercise Tolerance: Good negative cardio ROS  Rhythm:regular Rate:Normal     Neuro/Psych Anxiety negative neurological ROS     GI/Hepatic Neg liver ROS, hiatal hernia, GERD-  Medicated and Controlled,  Endo/Other  Morbid obesity  Renal/GU negative Renal ROS  negative genitourinary   Musculoskeletal   Abdominal (+) + obese,   Peds  Hematology  (+) anemia ,   Anesthesia Other Findings   Reproductive/Obstetrics negative OB ROS                         Anesthesia Physical Anesthesia Plan  ASA: III  Anesthesia Plan: General   Post-op Pain Management:    Induction: Intravenous  Airway Management Planned: Oral ETT  Additional Equipment:   Intra-op Plan:   Post-operative Plan: Extubation in OR  Informed Consent: I have reviewed the patients History and Physical, chart, labs and discussed the procedure including the risks, benefits and alternatives for the proposed anesthesia with the patient or authorized representative who has indicated his/her understanding and acceptance.   Dental Advisory Given  Plan Discussed with: CRNA and Surgeon  Anesthesia Plan Comments:         Anesthesia Quick Evaluation

## 2013-11-17 NOTE — Anesthesia Postprocedure Evaluation (Signed)
  Anesthesia Post-op Note  Patient: Jill Simpson  Procedure(s) Performed: Procedure(s) (LRB): LAPAROSCOPIC REPAIR OF HIATAL HERNIA AND REPLICATION OF POUCH (N/A) UPPER GI ENDOSCOPY (N/A)  Patient Location: PACU  Anesthesia Type: General  Level of Consciousness: awake and alert   Airway and Oxygen Therapy: Patient Spontanous Breathing  Post-op Pain: mild  Post-op Assessment: Post-op Vital signs reviewed, Patient's Cardiovascular Status Stable, Respiratory Function Stable, Patent Airway and No signs of Nausea or vomiting  Last Vitals:  Filed Vitals:   11/17/13 1100  BP: 158/88  Pulse: 81  Temp: 36.7 C  Resp: 13    Post-op Vital Signs: stable   Complications: No apparent anesthesia complications

## 2013-11-17 NOTE — Brief Op Note (Signed)
11/17/2013  10:36 AM  PATIENT:  Jill Simpson  43 y.o. female  PRE-OPERATIVE DIAGNOSIS:  laparoscopic hiatal hernia /plicating the pouch  POST-OPERATIVE DIAGNOSIS:  laparoscopic hiatal hernia /plicating the pouch  PROCEDURE:  Procedure(s): LAPAROSCOPIC REPAIR OF HIATAL HERNIA AND REPLICATION OF POUCH (N/A) UPPER GI ENDOSCOPY (N/A)  SURGEON:  Surgeon(s) and Role:    * Wenda Low, MD - Primary  PHYSICIAN ASSISTANT:   ASSISTANTS: Gaynelle Adu, MD, FACS   ANESTHESIA:   general  EBL:  Total I/O In: 2600 [I.V.:2600] Out: 200 [Urine:200]  BLOOD ADMINISTERED:none  DRAINS: none   LOCAL MEDICATIONS USED:  MARCAINE     SPECIMEN:  No Specimen  DISPOSITION OF SPECIMEN:  N/A  COUNTS:  YES  TOURNIQUET:  * No tourniquets in log *  DICTATION: .Other Dictation: Dictation Number [id number]  PLAN OF CARE: Admit for overnight observation  PATIENT DISPOSITION:  PACU - hemodynamically stable.   Delay start of Pharmacological VTE agent (>24hrs) due to surgical blood loss or risk of bleeding: no

## 2013-11-18 ENCOUNTER — Observation Stay (HOSPITAL_COMMUNITY): Payer: 59

## 2013-11-18 ENCOUNTER — Encounter (HOSPITAL_COMMUNITY): Payer: Self-pay | Admitting: Surgery

## 2013-11-18 DIAGNOSIS — K449 Diaphragmatic hernia without obstruction or gangrene: Secondary | ICD-10-CM | POA: Diagnosis not present

## 2013-11-18 LAB — CBC WITH DIFFERENTIAL/PLATELET
BASOS ABS: 0 10*3/uL (ref 0.0–0.1)
Basophils Relative: 0 % (ref 0–1)
EOS ABS: 0 10*3/uL (ref 0.0–0.7)
EOS PCT: 0 % (ref 0–5)
HCT: 36.2 % (ref 36.0–46.0)
Hemoglobin: 11.1 g/dL — ABNORMAL LOW (ref 12.0–15.0)
LYMPHS ABS: 1.2 10*3/uL (ref 0.7–4.0)
Lymphocytes Relative: 11 % — ABNORMAL LOW (ref 12–46)
MCH: 25.9 pg — AB (ref 26.0–34.0)
MCHC: 30.7 g/dL (ref 30.0–36.0)
MCV: 84.6 fL (ref 78.0–100.0)
Monocytes Absolute: 0.7 10*3/uL (ref 0.1–1.0)
Monocytes Relative: 6 % (ref 3–12)
Neutro Abs: 9.7 10*3/uL — ABNORMAL HIGH (ref 1.7–7.7)
Neutrophils Relative %: 83 % — ABNORMAL HIGH (ref 43–77)
PLATELETS: 210 10*3/uL (ref 150–400)
RBC: 4.28 MIL/uL (ref 3.87–5.11)
RDW: 15.9 % — ABNORMAL HIGH (ref 11.5–15.5)
WBC: 11.7 10*3/uL — AB (ref 4.0–10.5)

## 2013-11-18 MED ORDER — OXYCODONE HCL 5 MG/5ML PO SOLN
5.0000 mg | ORAL | Status: DC | PRN
  Administered 2013-11-18: 5 mg via ORAL
  Filled 2013-11-18: qty 5

## 2013-11-18 MED ORDER — UNJURY CHOCOLATE CLASSIC POWDER
2.0000 [oz_av] | Freq: Three times a day (TID) | ORAL | Status: DC | PRN
  Filled 2013-11-18: qty 27

## 2013-11-18 MED ORDER — OXYCODONE HCL 5 MG/5ML PO SOLN: 5.0000 mg | ORAL | Status: AC | PRN

## 2013-11-18 MED ORDER — OXYCODONE HCL 5 MG/5ML PO SOLN: 5.0000 mg | ORAL | Status: DC | PRN

## 2013-11-18 MED ORDER — UNJURY CHICKEN SOUP POWDER
2.0000 [oz_av] | Freq: Three times a day (TID) | ORAL | Status: DC | PRN
  Filled 2013-11-18: qty 27

## 2013-11-18 MED ORDER — UNJURY VANILLA POWDER
2.0000 [oz_av] | Freq: Three times a day (TID) | ORAL | Status: DC | PRN
  Filled 2013-11-18: qty 27

## 2013-11-18 MED ORDER — IOHEXOL 300 MG/ML  SOLN
50.0000 mL | Freq: Once | INTRAMUSCULAR | Status: AC | PRN
  Administered 2013-11-18: 50 mL via ORAL

## 2013-11-18 MED ORDER — SIMETHICONE 80 MG PO CHEW
80.0000 mg | CHEWABLE_TABLET | Freq: Once | ORAL | Status: AC
  Administered 2013-11-18: 80 mg via ORAL
  Filled 2013-11-18 (×2): qty 1

## 2013-11-18 MED ORDER — ACETAMINOPHEN 160 MG/5ML PO SOLN: 325.0000 mg | ORAL | Status: DC | PRN

## 2013-11-18 NOTE — Progress Notes (Signed)
Patient d/c home,ambulatory. Stable.Hulda Marin RN

## 2013-11-18 NOTE — Progress Notes (Signed)
Patient d/c instructions given, verbalized understanding. Prescriptions given. Stable to go home. Incisions to abdomen intact with liquid adhesive- Hulda Marin RN

## 2013-11-18 NOTE — Op Note (Signed)
NAME:  Jill Simpson, Jill Simpson NO.:  [id number]  MEDICAL RECORD NO.:  [id number]  LOCATION:  1336                         FACILITY:  [facility]  PHYSICIAN:  Thornton Park. Daphine Deutscher, MD  DATE OF BIRTH:  02-Nov-1970  DATE OF PROCEDURE:  11/17/2013 DATE OF DISCHARGE:                              OPERATIVE REPORT   PROCEDURE:  Laparoscopic repair of hiatal hernia posteriorly with anterior plication of the gastric pouch.  SURGEON:  Thornton Park. Daphine Deutscher, MD  ASSISTANTAndrey Campanile.  ANESTHESIA:  General.  DESCRIPTION OF PROCEDURE:  The patient was taken to room 6 at [facility] given general anesthesia.  The abdomen was prepped with PCMX and draped sterilely.  Time-out was performed.  Access to the abdomen was achieved through the left upper quadrant using a 5-mm Optiview without difficulty.  Following an insufflation, a 5 mm was placed to the left of the umbilicus.  The camera which subsequently upgraded to 11 because of the optics were not adequate in the foregut dissection.  We went with 11 and a 5 over to the right of the midline.  The 5 in the upper midline for the [facility] retractor and eventually Dr. Andrey Campanile added a second port laterally on the left.  The Wilder intact in place, we exposed her previous gastric bypass in the pouch.  I had to take down some chronic adhesions to the liver, I did this sharply and with Harmonic scalpel.  I went and found the gastrohepatic window and opened that.  I did a posterior dissection and found a patulous hiatus.  Anteriorly, I could see a dimple and on a preoperative upper GI, Dr. __________ evidence that her pouch was sliding up into her chest.  She was having some issues with reflux and swallowing and I felt we would repair this posteriorly.  With posterior dissection with adequate exposure, we saw the 2 leaves of the right and left crura and approximated those with EndoStitch and single 0 Surgidac held in place with tie knots.  Following  this, we had done the balloon test before doing this, and it was positive with a balloon migrating up into the esophagus and then following the repair, the balloon and the calibration tubing stayed in the abdomen.  I then explored the pouch and then looked at it posteriorly and the gastrojejunostomy and the remnant stomach.  I went ahead and broke scrub and went up and endoscoped the patient and there, we were able to help identify the anterior portion of the posterior pouch.  I elected at that point to come back and take down some of the adhesions between the remnant stomach and the pouch and then, I did an anterior plication with 2 sutures of Surgidac using EndoStitch and intracorporeal ties. Everything appeared good and there was no bleeding noted.  No evidence of any perforation from the endoscopy.  The gastrojejunostomy was patent and no evidence of ulcers were seen.  On going in, there was some stasis and some mild gastric and bilious material in her esophagus.  The port sites were all injected with 20 mL of Exparel. The abdomen was deflated and were closed with 4-0 Vicryl and Dermabond. The  patient tolerated the procedure well and was taken to the recovery room in satisfactory condition.     Thornton Park Daphine Deutscher, MD     MBM/MEDQ  D:  11/17/2013  T:  11/17/2013  Job:  [id number]

## 2013-12-07 NOTE — Discharge Summary (Signed)
Physician Discharge Summary  Patient ID: Jill Simpson MRN: [id number] DOB/AGE: [birth year] 43 y.o.  Admit date: 11/17/2013 Discharge date: 12/07/2013  Admission Diagnoses:  Hiatus hernia post bypass with weight regain  Discharge Diagnoses:  Posterior repair of hiatus hernia and plication of gastric pouch  Active Problems:   GERD (gastroesophageal reflux disease)   Surgery:  Laparoscopic repair of hiatus hernia and plication of gastric pouch  Discharged Condition: improved  Hospital Course:   Had surgery.  Had swallow on the first postop day.  Ready for discharge on PD 2.   Consults: none  Significant Diagnostic Studies: UGI    Discharge Exam: Blood pressure 133/62, pulse 63, temperature 98.1 F (36.7 C), temperature source Oral, resp. rate 16, height 5\' 5"  (1.651 m), weight 286 lb 9.6 oz (130 kg), SpO2 97.00%. Incisions look good.    Disposition: 01-Home or Self Care  Discharge Instructions   Diet - low sodium heart healthy    Complete by:  As directed      Discharge instructions    Complete by:  As directed   Follow post bypass diet as prescribed before     Increase activity slowly    Complete by:  As directed             Medication List    STOP taking these medications       oxyCODONE-acetaminophen 5-325 MG per tablet  Commonly known as:  PERCOCET/ROXICET      TAKE these medications       ergocalciferol 50000 UNITS capsule  Commonly known as:  VITAMIN D2  Take 50,000 Units by mouth once a week.     ferrous sulfate 75 (15 FE) MG/ML Soln  Commonly known as:  FER-IN-SOL  Take 15 mg of iron by mouth 2 (two) times a week.     meloxicam 7.5 MG tablet  Commonly known as:  MOBIC  Take 7.5 mg by mouth daily as needed for pain.     oxyCODONE 5 MG/5ML solution  Commonly known as:  ROXICODONE  Take 5-10 mLs (5-10 mg total) by mouth every 4 (four) hours as needed for moderate pain or severe pain.     pantoprazole 40 MG tablet  Commonly known as:  PROTONIX   Take 40 mg by mouth daily as needed (acid reflux).     venlafaxine XR 37.5 MG 24 hr capsule  Commonly known as:  EFFEXOR-XR  Take 37.5 mg by mouth every morning.     zolpidem 10 MG tablet  Commonly known as:  AMBIEN  Take 10 mg by mouth at bedtime.           Follow-up Information   Follow up with Luretha MurphyMARTIN,Rosan Calbert B, MD In 3 weeks.   Specialty:  General Surgery   Contact information:   [address] [place] [address] [phone]       Signed: Valarie MerinoMARTIN,Brinklee Cisse B 12/07/2013, 7:33 AM

## 2014-01-05 ENCOUNTER — Other Ambulatory Visit (INDEPENDENT_AMBULATORY_CARE_PROVIDER_SITE_OTHER): Payer: Self-pay

## 2014-01-05 DIAGNOSIS — Z9884 Bariatric surgery status: Secondary | ICD-10-CM

## 2015-07-11 IMAGING — RF DG UGI W/ GASTROGRAFIN
9 series · 9 of 9 positions shown · IV contrast (omnipaque)
Comparison: Upper GI series of November 20, 2006.

FLUOROSCOPY TIME:  0 min, 30 seconds.

CLINICAL DATA: Status post hiatal hernia repair yesterday. Remote
history of previous Roux en Y procedure

EXAM:
WATER SOLUBLE UPPER GI SERIES
TECHNIQUE: Single-column upper GI series was performed using water soluble
contrast.
CONTRAST:  50mL OMNIPAQUE IOHEXOL 300 MG/ML  SOLN

[Series 1: run · 1 of 1 slices shown (1 of 8)]
[im 1/1]
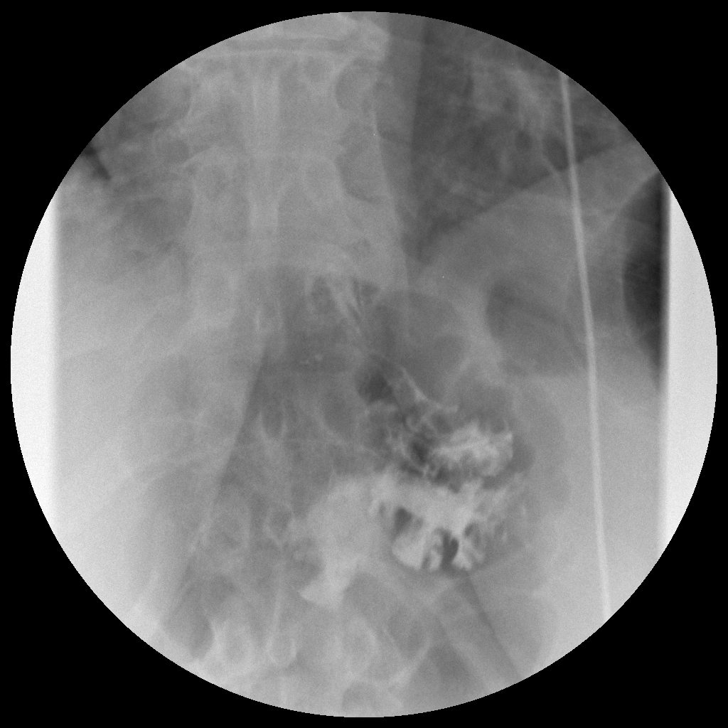

[Series 2: run · 1 of 1 slices shown (2 of 8)]
[im 1/1]
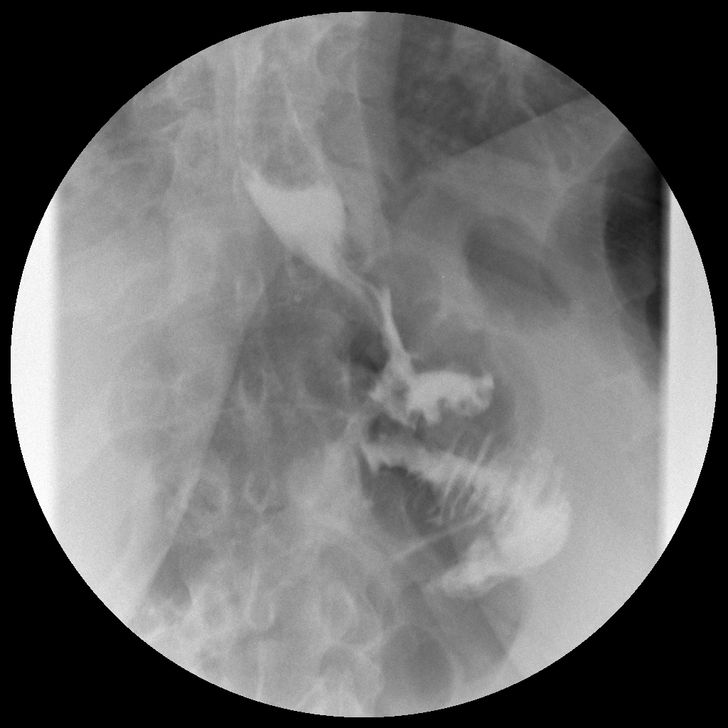

[Series 3: run · 1 of 1 slices shown (3 of 8)]
[im 1/1]
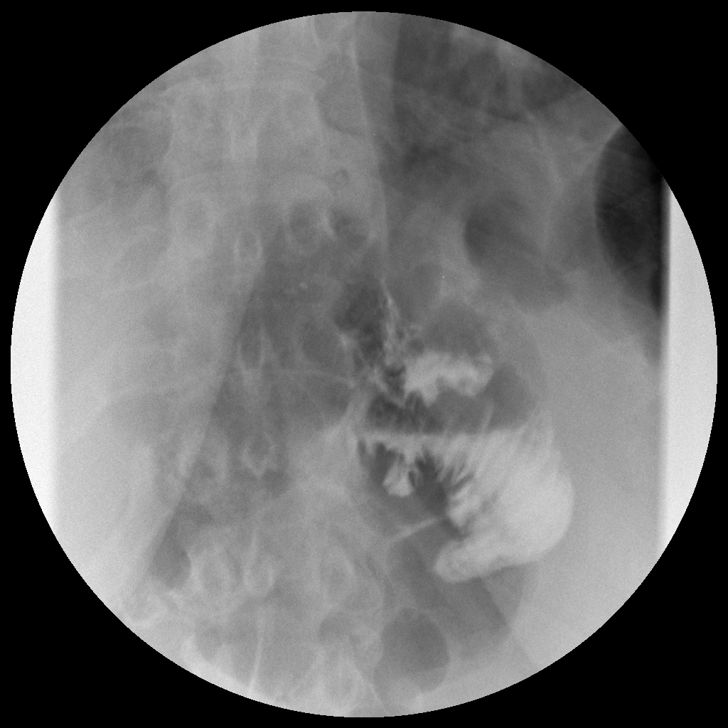

[Series 4: run · 1 of 1 slices shown (4 of 8)]
[im 1/1]
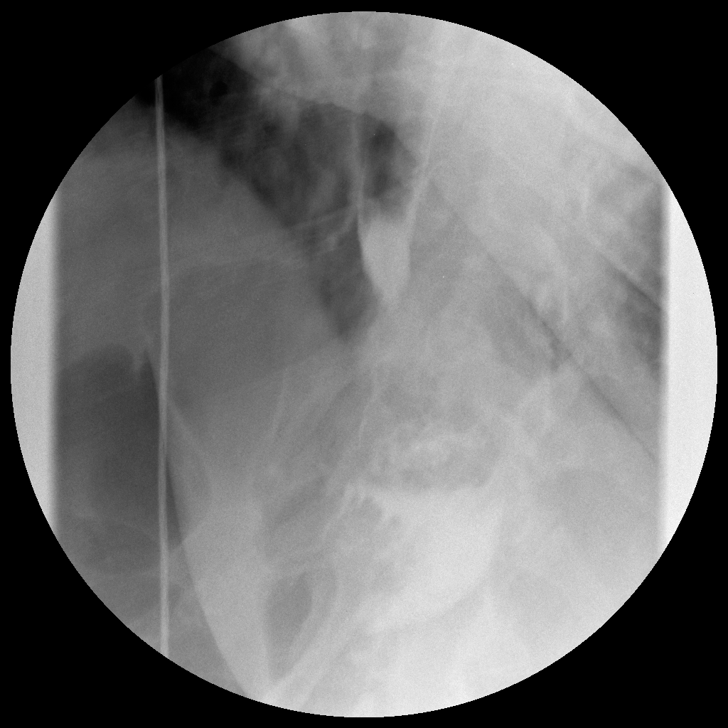

[Series 5: run · 1 of 1 slices shown (5 of 8)]
[im 1/1]
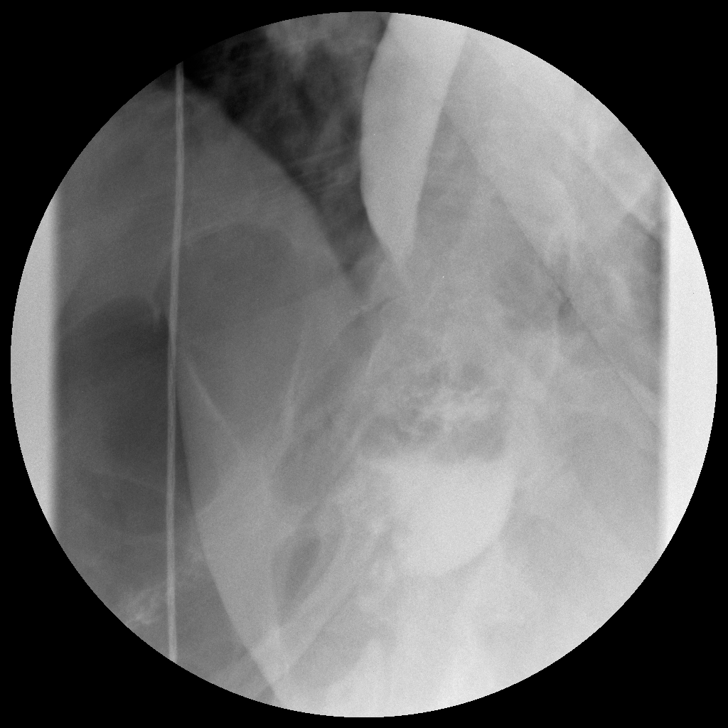

[Series 6: run · 1 of 1 slices shown (6 of 8)]
[im 1/1]
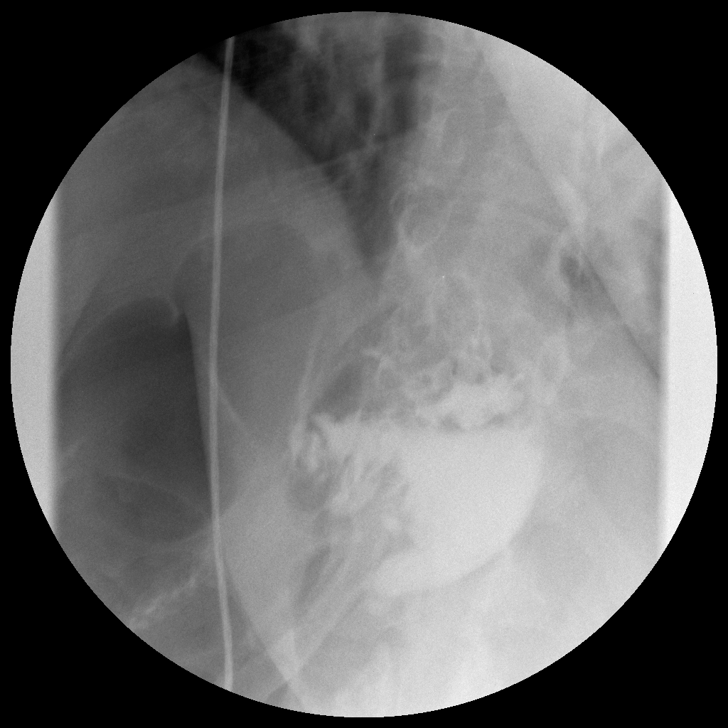

[Series 7: run · 1 of 1 slices shown (7 of 8)]
[im 1/1]
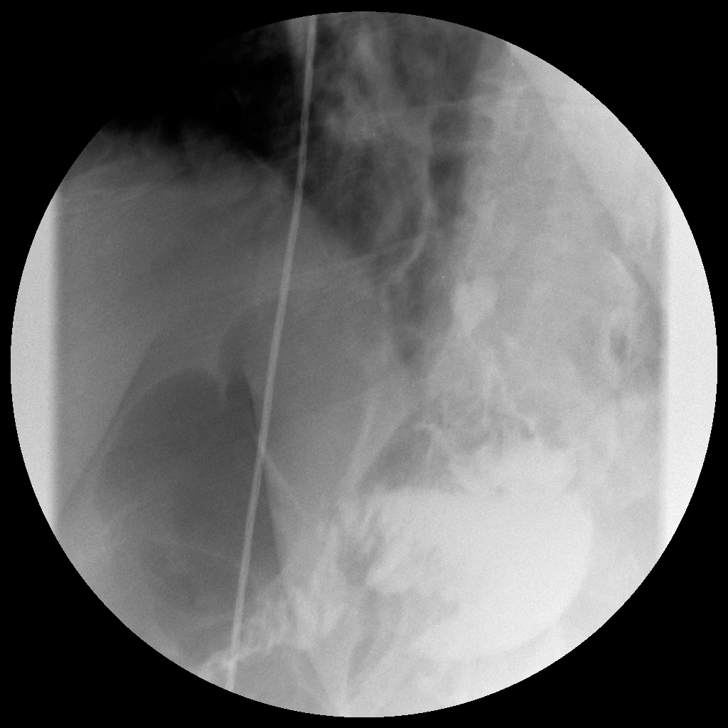

[Series 8: run · 1 of 1 slices shown (8 of 8)]
[im 1/1]
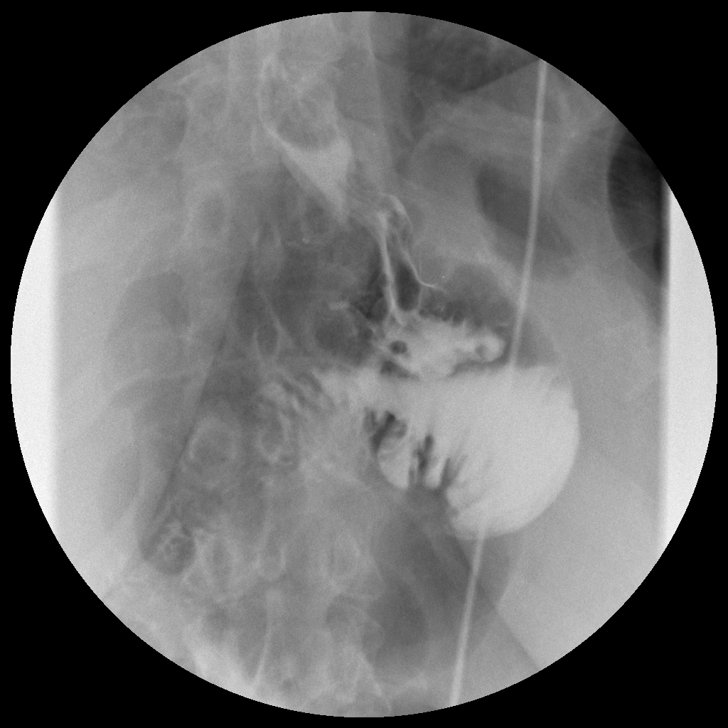

[Series 1001: view not recorded · 0.20mm/px · 1 of 1 slices shown]
[im 1/1]
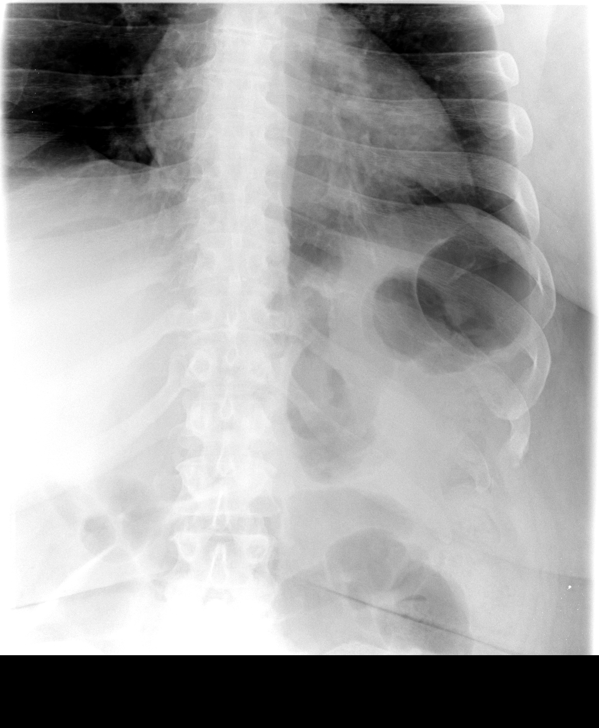

[9 of 9 positions shown; findings below may reference images not displayed]

FINDINGS: The patient ingested a total of 50 cc of Omnipaque 300 without
difficulty. The contrast passed from the distal esophagus into the
gastric remnant without difficulty. There was no evidence of a
hiatal hernia. No leak was demonstrated. Prompt emptying of the
gastric remnant into the small bowel was demonstrated.
IMPRESSION: There is no evidence of a leak or other postprocedure complication
following the patient's hiatal hernia surgery.

## 2016-06-19 ENCOUNTER — Encounter (HOSPITAL_COMMUNITY): Payer: Self-pay

## 2017-06-20 ENCOUNTER — Encounter (HOSPITAL_COMMUNITY): Payer: Self-pay
